# Patient Record
Sex: Female | Born: 1995
Health system: Southern US, Community
[De-identification: ages and names within clinical notes are randomized; demographics above are authoritative.]

---
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
## 2015-02-16 IMAGING — CR DG CHEST 2V
2 series · 2 of 2 positions shown · non-contrast
Comparison: None.

CLINICAL DATA: Intermittent cough for 5 months.

EXAM:
CHEST  2 VIEW

[view not recorded (1 of 2)]
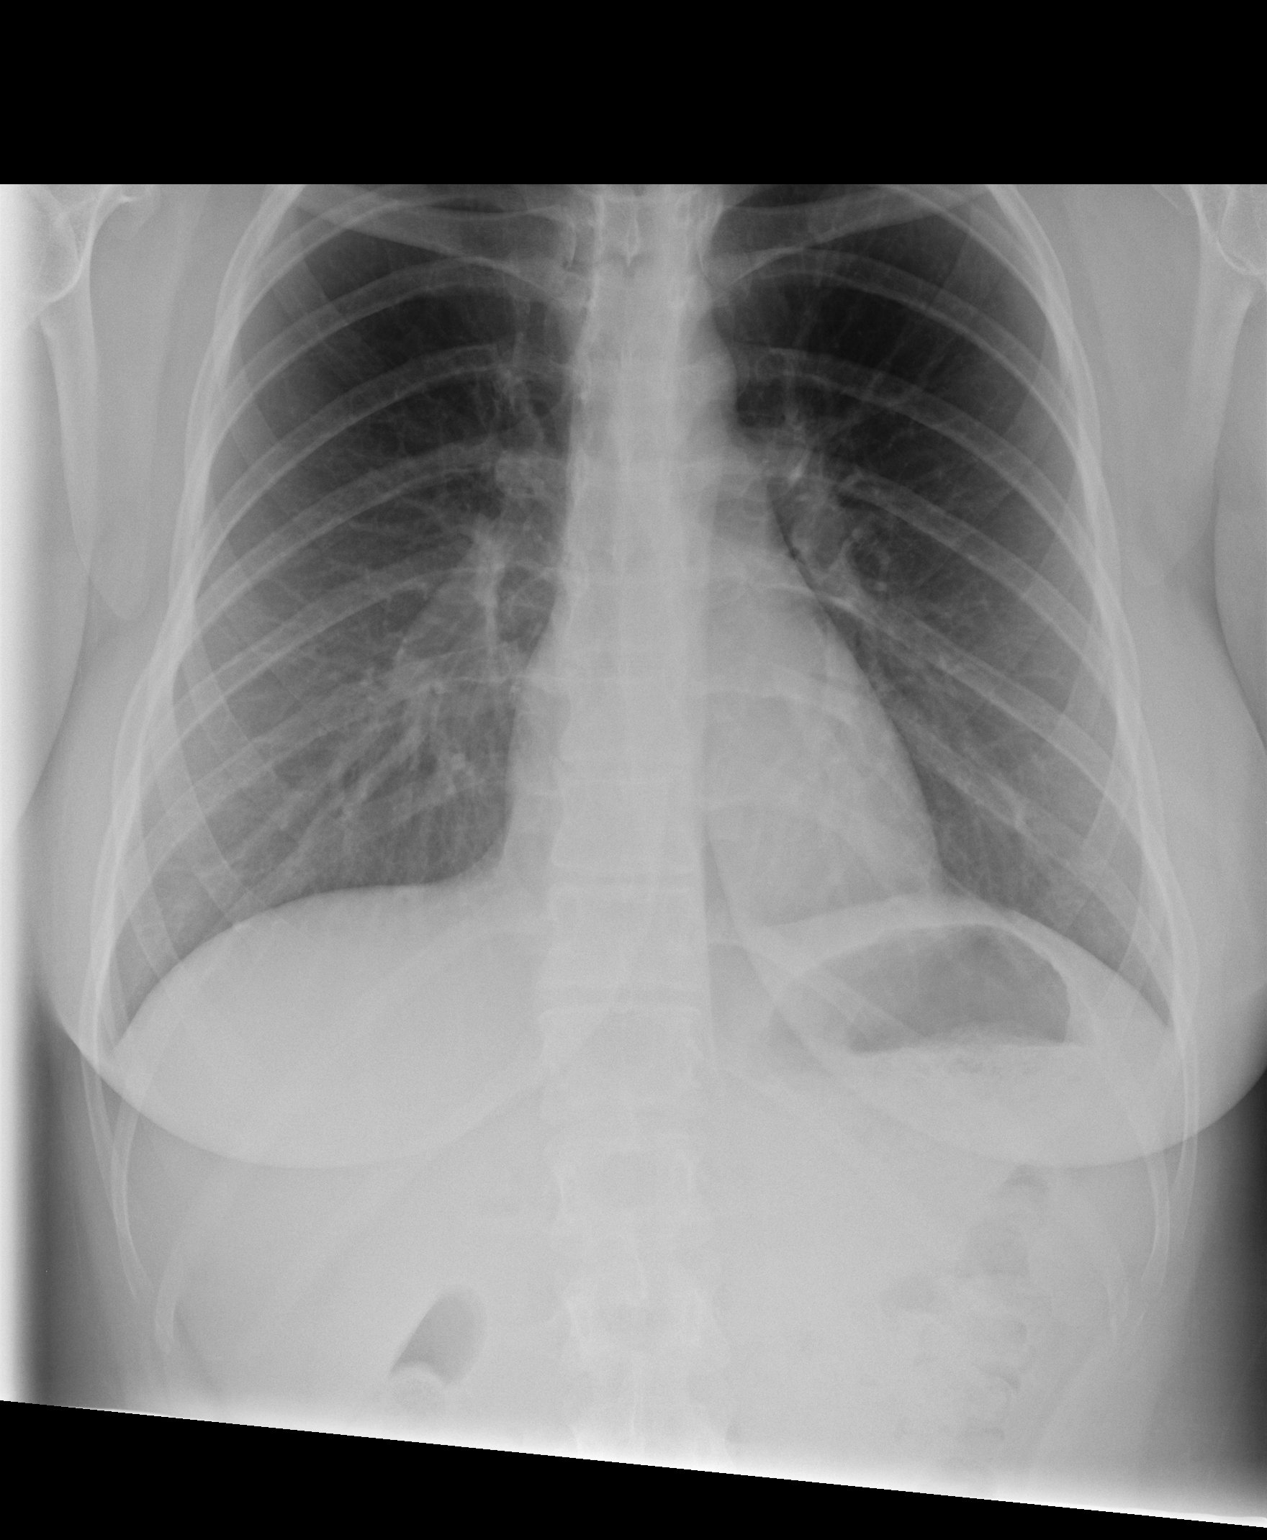

[view not recorded (2 of 2)]
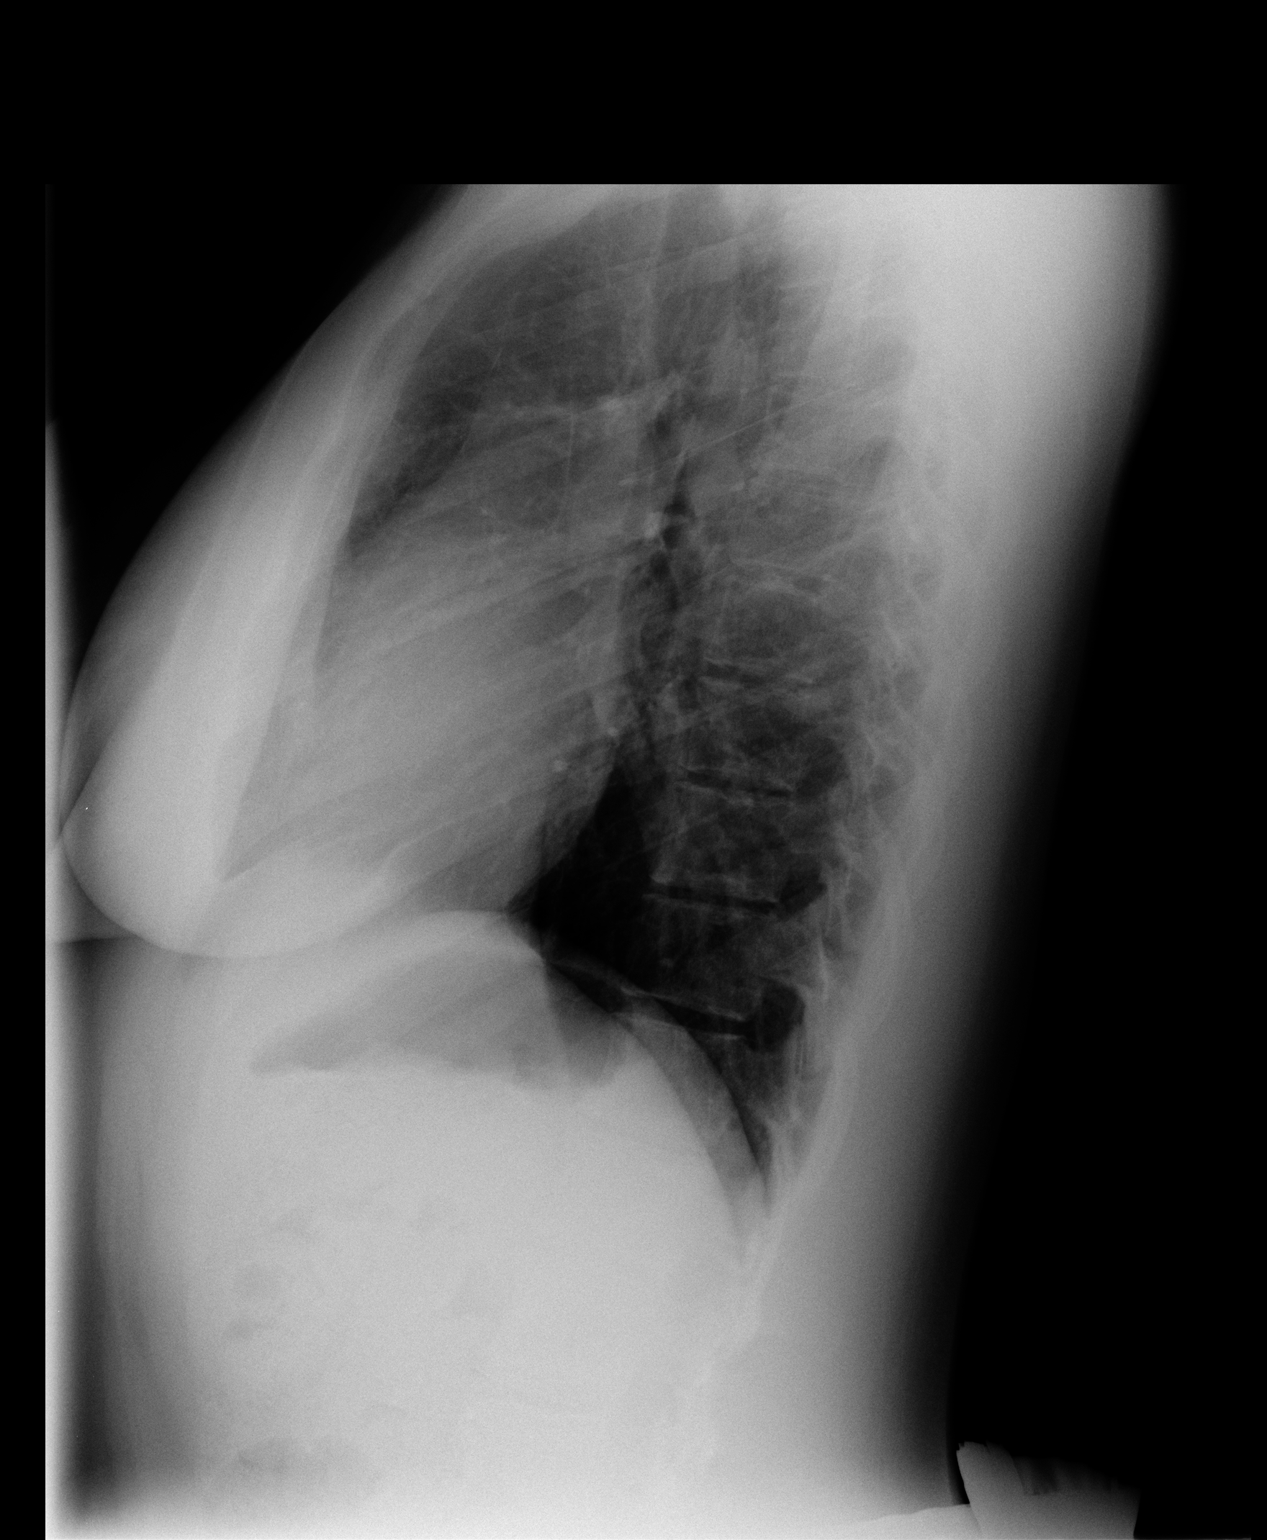

[2 of 2 positions shown; findings below may reference images not displayed]

FINDINGS: The heart size and mediastinal contours are within normal limits.
Both lungs are clear. The visualized skeletal structures are
unremarkable.
IMPRESSION: Normal chest radiographs.

## 2017-05-28 ENCOUNTER — Other Ambulatory Visit: Payer: Self-pay | Admitting: Physician Assistant

## 2017-05-28 ENCOUNTER — Other Ambulatory Visit (HOSPITAL_COMMUNITY)
Admission: RE | Admit: 2017-05-28 | Discharge: 2017-05-28 | Disposition: A | Discharge status: Home / Self Care | Payer: Self-pay | Source: Ambulatory Visit | Attending: Physician Assistant | Admitting: Physician Assistant

## 2017-05-28 DIAGNOSIS — Z124 Encounter for screening for malignant neoplasm of cervix: Secondary | ICD-10-CM | POA: Insufficient documentation

## 2017-06-03 LAB — CYTOLOGY - PAP: HPV (WINDOPATH): DETECTED — AB

## 2017-12-04 ENCOUNTER — Other Ambulatory Visit (HOSPITAL_COMMUNITY)
Admission: RE | Admit: 2017-12-04 | Discharge: 2017-12-04 | Disposition: A | Discharge status: Home / Self Care | Payer: Medicaid Other | Source: Ambulatory Visit | Attending: Physician Assistant | Admitting: Physician Assistant

## 2017-12-04 ENCOUNTER — Other Ambulatory Visit: Payer: Self-pay | Admitting: Physician Assistant

## 2017-12-04 DIAGNOSIS — F322 Major depressive disorder, single episode, severe without psychotic features: Secondary | ICD-10-CM | POA: Diagnosis not present

## 2017-12-04 DIAGNOSIS — R51 Headache: Secondary | ICD-10-CM | POA: Diagnosis not present

## 2017-12-04 DIAGNOSIS — R87619 Unspecified abnormal cytological findings in specimens from cervix uteri: Secondary | ICD-10-CM | POA: Diagnosis not present

## 2017-12-04 DIAGNOSIS — Z124 Encounter for screening for malignant neoplasm of cervix: Secondary | ICD-10-CM | POA: Insufficient documentation

## 2017-12-11 LAB — CYTOLOGY - PAP
Diagnosis: UNDETERMINED — AB
HPV (WINDOPATH): NOT DETECTED

## 2017-12-22 DIAGNOSIS — Z23 Encounter for immunization: Secondary | ICD-10-CM | POA: Diagnosis not present

## 2018-05-28 DIAGNOSIS — Z3041 Encounter for surveillance of contraceptive pills: Secondary | ICD-10-CM | POA: Diagnosis not present

## 2018-05-28 DIAGNOSIS — B373 Candidiasis of vulva and vagina: Secondary | ICD-10-CM | POA: Diagnosis not present

## 2018-05-28 DIAGNOSIS — Z3202 Encounter for pregnancy test, result negative: Secondary | ICD-10-CM | POA: Diagnosis not present

## 2018-05-28 DIAGNOSIS — Z113 Encounter for screening for infections with a predominantly sexual mode of transmission: Secondary | ICD-10-CM | POA: Diagnosis not present

## 2018-11-18 DIAGNOSIS — Z0000 Encounter for general adult medical examination without abnormal findings: Secondary | ICD-10-CM | POA: Diagnosis not present

## 2018-11-18 DIAGNOSIS — R51 Headache: Secondary | ICD-10-CM | POA: Diagnosis not present

## 2018-11-18 DIAGNOSIS — F322 Major depressive disorder, single episode, severe without psychotic features: Secondary | ICD-10-CM | POA: Diagnosis not present

## 2019-04-14 DIAGNOSIS — R350 Frequency of micturition: Secondary | ICD-10-CM | POA: Diagnosis not present

## 2019-04-14 DIAGNOSIS — R110 Nausea: Secondary | ICD-10-CM | POA: Diagnosis not present

## 2019-11-30 ENCOUNTER — Telehealth: Payer: Self-pay | Admitting: *Deleted

## 2019-11-30 NOTE — Telephone Encounter (Signed)
Pt states she had 1 first vaccine and had facial swelling, difficulty swallowing. States she was told 2nd vaccine would need to be administered in hospital setting. Pt states she does have PCP. Advised to call PCP to initiate. Pt verbalizes understanding. Advised to CB if needed.

## 2020-01-19 DIAGNOSIS — F331 Major depressive disorder, recurrent, moderate: Secondary | ICD-10-CM | POA: Diagnosis not present

## 2020-02-15 DIAGNOSIS — F331 Major depressive disorder, recurrent, moderate: Secondary | ICD-10-CM | POA: Diagnosis not present

## 2020-03-07 DIAGNOSIS — F439 Reaction to severe stress, unspecified: Secondary | ICD-10-CM | POA: Diagnosis not present

## 2020-03-08 DIAGNOSIS — F419 Anxiety disorder, unspecified: Secondary | ICD-10-CM | POA: Diagnosis not present

## 2020-03-08 DIAGNOSIS — R41840 Attention and concentration deficit: Secondary | ICD-10-CM | POA: Diagnosis not present

## 2020-03-08 DIAGNOSIS — F338 Other recurrent depressive disorders: Secondary | ICD-10-CM | POA: Diagnosis not present

## 2020-03-26 DIAGNOSIS — Z23 Encounter for immunization: Secondary | ICD-10-CM | POA: Diagnosis not present

## 2020-03-29 DIAGNOSIS — F331 Major depressive disorder, recurrent, moderate: Secondary | ICD-10-CM | POA: Diagnosis not present

## 2020-05-09 DIAGNOSIS — F900 Attention-deficit hyperactivity disorder, predominantly inattentive type: Secondary | ICD-10-CM | POA: Diagnosis not present

## 2020-05-09 DIAGNOSIS — F331 Major depressive disorder, recurrent, moderate: Secondary | ICD-10-CM | POA: Diagnosis not present

## 2020-06-07 DIAGNOSIS — F331 Major depressive disorder, recurrent, moderate: Secondary | ICD-10-CM | POA: Diagnosis not present

## 2020-06-07 DIAGNOSIS — F900 Attention-deficit hyperactivity disorder, predominantly inattentive type: Secondary | ICD-10-CM | POA: Diagnosis not present

## 2020-08-15 DIAGNOSIS — F331 Major depressive disorder, recurrent, moderate: Secondary | ICD-10-CM | POA: Diagnosis not present

## 2020-08-15 DIAGNOSIS — F900 Attention-deficit hyperactivity disorder, predominantly inattentive type: Secondary | ICD-10-CM | POA: Diagnosis not present
